# Patient Record
Sex: Female | Born: 1965 | Race: White | Hispanic: No | Marital: Married | Smoking: Current every day smoker
Health system: Southern US, Community
[De-identification: ages and names within clinical notes are randomized; demographics above are authoritative.]

## PROBLEM LIST (undated history)

## (undated) DIAGNOSIS — M1990 Unspecified osteoarthritis, unspecified site: Secondary | ICD-10-CM

## (undated) DIAGNOSIS — N200 Calculus of kidney: Secondary | ICD-10-CM

## (undated) DIAGNOSIS — C801 Malignant (primary) neoplasm, unspecified: Secondary | ICD-10-CM

## (undated) DIAGNOSIS — M7580 Other shoulder lesions, unspecified shoulder: Secondary | ICD-10-CM

## (undated) DIAGNOSIS — C539 Malignant neoplasm of cervix uteri, unspecified: Secondary | ICD-10-CM

## (undated) HISTORY — PX: ABDOMINAL HYSTERECTOMY: SHX81

---

## 2014-10-17 ENCOUNTER — Ambulatory Visit

## 2014-10-17 ENCOUNTER — Ambulatory Visit
Admission: EM | Admit: 2014-10-17 | Discharge: 2014-10-17 | Disposition: A | Discharge status: Home / Self Care | Attending: Family Medicine | Admitting: Family Medicine

## 2014-10-17 DIAGNOSIS — R300 Dysuria: Secondary | ICD-10-CM | POA: Diagnosis not present

## 2014-10-17 HISTORY — DX: Calculus of kidney: N20.0

## 2014-10-17 HISTORY — DX: Malignant (primary) neoplasm, unspecified: C80.1

## 2014-10-17 HISTORY — DX: Malignant neoplasm of cervix uteri, unspecified: C53.9

## 2014-10-17 LAB — URINALYSIS COMPLETE WITH MICROSCOPIC (ARMC ONLY)
BILIRUBIN URINE: NEGATIVE
Glucose, UA: NEGATIVE mg/dL
Hgb urine dipstick: NEGATIVE
KETONES UR: NEGATIVE mg/dL
Leukocytes, UA: NEGATIVE
Nitrite: NEGATIVE
Protein, ur: NEGATIVE mg/dL
RBC / HPF: NONE SEEN RBC/hpf (ref <3)
Specific Gravity, Urine: 1.02 (ref 1.005–1.030)
WBC, UA: NONE SEEN WBC/hpf (ref <3)
pH: 6 (ref 5.0–8.0)

## 2014-10-17 MED ORDER — IBUPROFEN 800 MG PO TABS: 800.0000 mg | ORAL_TABLET | Freq: Three times a day (TID) | ORAL | Status: DC | PRN

## 2014-10-17 NOTE — ED Provider Notes (Signed)
CSN: [id number]     Arrival date & time 10/17/14  1937 History   First MD Initiated Contact with Patient 10/17/14 1027     Chief Complaint  Patient presents with  . Medication Refill   (Consider location/radiation/quality/duration/timing/severity/associated sxs/prior Treatment) HPI Comments: Single caucasian female here for refill of vicodin as passed kidney stone yesterday still having right hip/groin pain radiating took vicodin 1 tab 0805 this am for 8/10 pain currently 0/10.  Denied fever, chills, headache, blood in urine, flank/back pain, nausea, vomiting, diarrhea, abdomen pain.  Patient in [place] visiting daughter.  Stated fills rx at [facility] and [facility].  Has used flomax in the past.  Does not have urine strainer.  Burning with urination also.  The history is provided by the patient.    Past Medical History  Diagnosis Date  . Kidney stones   . Cancer   . Cervical cancer    Past Surgical History  Procedure Laterality Date  . Abdominal hysterectomy     Family History  Problem Relation Age of Onset  . Cancer Mother    Social History  Substance Use Topics  . Smoking status: Current Every Day Smoker -- 2.00 packs/day  . Smokeless tobacco: None  . Alcohol Use: No   OB History    No data available     Review of Systems  Constitutional: Negative for fever, chills, diaphoresis, activity change, appetite change, fatigue and unexpected weight change.  HENT: Negative for congestion, dental problem, drooling, ear discharge, ear pain, facial swelling, hearing loss, mouth sores, nosebleeds, trouble swallowing and voice change.   Eyes: Negative for photophobia, pain, discharge, redness, itching and visual disturbance.  Respiratory: Negative for cough, choking, shortness of breath, wheezing and stridor.   Cardiovascular: Negative for chest pain and leg swelling.  Gastrointestinal: Negative for nausea, vomiting, abdominal pain, diarrhea, constipation, blood in stool and abdominal  distention.  Endocrine: Negative for cold intolerance and heat intolerance.  Genitourinary: Negative for dysuria, urgency, frequency, hematuria, flank pain, decreased urine volume, enuresis, difficulty urinating and pelvic pain.  Musculoskeletal: Negative for myalgias, back pain, joint swelling, arthralgias, gait problem, neck pain and neck stiffness.  Skin: Negative for color change, pallor, rash and wound.  Allergic/Immunologic: Negative for environmental allergies and food allergies.  Neurological: Negative for dizziness, tremors, seizures, syncope, facial asymmetry, speech difficulty, weakness, light-headedness, numbness and headaches.  Hematological: Negative for adenopathy. Does not bruise/bleed easily.  Psychiatric/Behavioral: Negative for behavioral problems, confusion, sleep disturbance and agitation.    Allergies  Review of patient's allergies indicates no known allergies.  Home Medications   Prior to Admission medications   Medication Sig Start Date End Date Taking? Authorizing Provider  HYDROcodone-acetaminophen (NORCO/VICODIN) 5-325 MG per tablet Take 1 tablet by mouth every 6 (six) hours as needed for moderate pain.   Yes Historical Provider, MD   Meds Ordered and Administered this Visit  Medications - No data to display  BP 129/79 mmHg  Pulse 74  Temp(Src) 97 F (36.1 C) (Tympanic)  Resp 16  Ht 5\' 2"  (1.575 m)  Wt 130 lb (58.968 kg)  BMI 23.77 kg/m2  SpO2 100% No data found.   Physical Exam  Constitutional: She is oriented to person, place, and time. Vital signs are normal. She appears well-developed and well-nourished. No distress.  HENT:  Head: Normocephalic and atraumatic.  Right Ear: External ear normal.  Left Ear: External ear normal.  Nose: Nose normal.  Mouth/Throat: Oropharynx is clear and moist. No oropharyngeal exudate.  Eyes: Conjunctivae,  EOM and lids are normal. Pupils are equal, round, and reactive to light. Right eye exhibits no discharge. Left  eye exhibits no discharge. No scleral icterus.  Neck: Normal range of motion. Neck supple. No tracheal deviation present.  Cardiovascular: Normal rate, regular rhythm, normal heart sounds and intact distal pulses.  Exam reveals no gallop and no friction rub.   No murmur heard. Pulmonary/Chest: Effort normal and breath sounds normal. No stridor. No respiratory distress. She has no wheezes. She has no rales.  Abdominal: Soft. Bowel sounds are normal. She exhibits no shifting dullness, no distension, no pulsatile liver, no fluid wave, no abdominal bruit, no ascites, no pulsatile midline mass and no mass. There is no hepatosplenomegaly. There is no tenderness. There is no rigidity, no rebound, no guarding, no CVA tenderness, no tenderness at McBurney's point and negative Murphy's sign. Hernia confirmed negative in the ventral area.  Musculoskeletal: Normal range of motion. She exhibits no edema or tenderness.  Lymphadenopathy:    She has no cervical adenopathy.  Neurological: She is alert and oriented to person, place, and time. She exhibits normal muscle tone. Coordination normal.  Skin: Skin is warm, dry and intact. No rash noted. She is not diaphoretic. No erythema. No pallor.  Psychiatric: She has a normal mood and affect. Her speech is normal and behavior is normal. Judgment and thought content normal. Cognition and memory are normal.  Nursing note and vitals reviewed.   ED Course  Procedures (including critical care time)  Labs Review Labs Reviewed  URINALYSIS COMPLETEWITH MICROSCOPIC ([place]) - Abnormal; Notable for the following:    Squamous Epithelial / LPF 0-5 (*)    All other components within normal limits  URINE CULTURE    Imaging Review No results found.  1050 KUB pending.  Discussed urinalysis results with patient and given copy of report.  Refused offer of toradol at this time.  Prefers motrin.  Discussed straining urine.  Would like flomax Rx.  Signed authorization for  records release [place] providers.  Reviewed [place] Controlled Substances website patient has not received any Rx since Mar 2016 in [place].  1100 patient asked to go out to car to get phone to make important call while waiting for xray tech.  Patient ambulatory to car without difficulty.  [facility] and [facility].  [facility] stated no vicodin Rx filled in past 6 months.  [facility].  Last UTI 07/28/2014 urine culture contaminated multiple species  Cr 1.07.  1115 Checked for patient in exam room and waiting room not there.  Called contact # [phone] and daughter stated patient at doctor not at home.  Asked daughter to contact mother to let her know we are still waiting for her to come back inside/return for xrays/labs.  Daughter verbalized understanding of information/instructions and had no further questions at this time.  [place] contacted daughter again via telephone. Daughter was unable to reach patient via cell phone.   Patient has not returned to clinic front desk to complete examination.  Security checked parking lot to ensure patient not in distress in parking lot.  Dr Zenda Alpers notified.  MDM   1. Dysuria   Plan: 1. Test/x-ray results and diagnosis reviewed with patient and given copy of urinalysis results.  Discussed what crystals were with patient. 2. rx as per orders; risks, benefits, potential side effects reviewed with patient 3. Recommend supportive treatment with fluids, motrin 4. F/u prn if symptoms worsen or don't improve   Urine culture  will be available in 48 hours will call with results once available.  Strain all urine.  Hydrate, hydrate.  Motrin 800mg  po TID. Patient is also to push fluids and strain urine. Call or return to clinic as needed if these symptoms worsen or fail to improve as anticipated e.g. gross hematuria, fever, worsening pain, unable to void.  Exitcare handout on nephrolithiasis, dysuria, diet to prevent kidney stones given to patient.   Patient verbalized agreement and understanding of treatment plan and had no further questions at this time. P2:  Hydrate, post coital urination, and cranberry juice  Olen Cordial, NP 10/17/14 1230

## 2014-10-17 NOTE — ED Notes (Signed)
Hx of Kidney Stones. States passed small kidney stone at 2am. Had right flank pain and took "my last Vicodin". "I just need some more pain medication".

## 2014-10-17 NOTE — ED Notes (Signed)
Patient had been waiting for an xray, but stated "had to out to car". After waiting 45 minutes, documented that patient left AMA without signing. All incomplete orders cancelled

## 2014-10-17 NOTE — Discharge Instructions (Signed)
Dysuria Dysuria is the medical term for pain with urination. There are many causes for dysuria, but urinary tract infection is the most common. If a urinalysis was performed it can show that there is a urinary tract infection. A urine culture confirms that you or your child is sick. You will need to follow up with a healthcare provider because:  If a urine culture was done you will need to know the culture results and treatment recommendations.  If the urine culture was positive, you or your child will need to be put on antibiotics or know if the antibiotics prescribed are the right antibiotics for your urinary tract infection.  If the urine culture is negative (no urinary tract infection), then other causes may need to be explored or antibiotics need to be stopped. Today laboratory work may have been done and there does not seem to be an infection. If cultures were done they will take at least 24 to 48 hours to be completed. Today x-rays may have been taken and they read as normal. No cause can be found for the problems. The x-rays may be re-read by a radiologist and you will be contacted if additional findings are made. You or your child may have been put on medications to help with this problem until you can see your primary caregiver. If the problems get better, see your primary caregiver if the problems return. If you were given antibiotics (medications which kill germs), take all of the mediations as directed for the full course of treatment.  If laboratory work was done, you need to find the results. Leave a telephone number where you can be reached. If this is not possible, make sure you find out how you are to get test results. HOME CARE INSTRUCTIONS   Drink lots of fluids. For adults, drink eight, 8 ounce glasses of clear juice or water a day. For children, replace fluids as suggested by your caregiver.  Empty the bladder often. Avoid holding urine for long periods of time.  After a bowel  movement, women should cleanse front to back, using each tissue only once.  Empty your bladder before and after sexual intercourse.  Take all the medicine given to you until it is gone. You may feel better in a few days, but TAKE ALL MEDICINE.  Avoid caffeine, tea, alcohol and carbonated beverages, because they tend to irritate the bladder.  In men, alcohol may irritate the prostate.  Only take over-the-counter or prescription medicines for pain, discomfort, or fever as directed by your caregiver.  If your caregiver has given you a follow-up appointment, it is very important to keep that appointment. Not keeping the appointment could result in a chronic or permanent injury, pain, and disability. If there is any problem keeping the appointment, you must call back to this facility for assistance. SEEK IMMEDIATE MEDICAL CARE IF:   Back pain develops.  A fever develops.  There is nausea (feeling sick to your stomach) or vomiting (throwing up).  Problems are no better with medications or are getting worse. MAKE SURE YOU:   Understand these instructions.  Will watch your condition.  Will get help right away if you are not doing well or get worse. Document Released: 10/16/2003 Document Revised: 04/11/2011 Document Reviewed: 08/23/2007 [facility] Patient Information 2015 [place], [place]. This information is not intended to replace advice given to you by your health care provider. Make sure you discuss any questions you have with your health care provider. Dietary Guidelines to Help  Prevent Kidney Stones Your risk of kidney stones can be decreased by adjusting the foods you eat. The most important thing you can do is drink enough fluid. You should drink enough fluid to keep your urine clear or pale yellow. The following guidelines provide specific information for the type of kidney stone you have had. GUIDELINES ACCORDING TO TYPE OF KIDNEY STONE Calcium Oxalate Kidney Stones  Reduce the  amount of salt you eat. Foods that have a lot of salt cause your body to release excess calcium into your urine. The excess calcium can combine with a substance called oxalate to form kidney stones.  Reduce the amount of animal protein you eat if the amount you eat is excessive. Animal protein causes your body to release excess calcium into your urine. Ask your dietitian how much protein from animal sources you should be eating.  Avoid foods that are high in oxalates. If you take vitamins, they should have less than 500 mg of vitamin C. Your body turns vitamin C into oxalates. You do not need to avoid fruits and vegetables high in vitamin C. Calcium Phosphate Kidney Stones  Reduce the amount of salt you eat to help prevent the release of excess calcium into your urine.  Reduce the amount of animal protein you eat if the amount you eat is excessive. Animal protein causes your body to release excess calcium into your urine. Ask your dietitian how much protein from animal sources you should be eating.  Get enough calcium from food or take a calcium supplement (ask your dietitian for recommendations). Food sources of calcium that do not increase your risk of kidney stones include:  Broccoli.  Dairy products, such as cheese and yogurt.  Pudding. Uric Acid Kidney Stones  Do not have more than 6 oz of animal protein per day. FOOD SOURCES Animal Protein Sources  Meat (all types).  Poultry.  Eggs.  Fish, seafood. Foods High in [employer] seasonings.  Soy sauce.  Teriyaki sauce.  Cured and processed meats.  Salted crackers and snack foods.  Fast food.  Canned soups and most canned foods. Foods High in Oxalates  Grains:  Amaranth.  Barley.  Grits.  Wheat germ.  Bran.  Buckwheat flour.  All bran cereals.  Pretzels.  Whole wheat bread.  Vegetables:  Beans (wax).  Beets and beet greens.  Collard  greens.  Eggplant.  [place].  Leeks.  Okra.  Parsley.  Rutabagas.  Spinach.  Swiss chard.  Tomato paste.  Fried potatoes.  Sweet potatoes.  Fruits:  Red currants.  Figs.  Kiwi.  Rhubarb.  Meat and Other Protein Sources:  Beans (dried).  Soy burgers and other soybean products.  Miso.  Nuts (peanuts, almonds, pecans, cashews, hazelnuts).  Nut butters.  Sesame seeds and tahini (paste made of sesame seeds).  Poppy seeds.  Beverages:  Chocolate drink mixes.  Soy milk.  Instant iced tea.  Juices made from high-oxalate fruits or vegetables.  Other:  Carob.  Chocolate.  Fruitcake.  Marmalades. Document Released: 05/14/2010 Document Revised: 01/22/2013 Document Reviewed: 12/14/2012 [facility] Patient Information 2015 [place], [place]. This information is not intended to replace advice given to you by your health care provider. Make sure you discuss any questions you have with your health care provider. Kidney Stones Kidney stones (urolithiasis) are deposits that form inside your kidneys. The intense pain is caused by the stone moving through the urinary tract. When the stone moves, the ureter goes into spasm around the stone. The stone is  usually passed in the urine.  CAUSES   A disorder that makes certain neck glands produce too much parathyroid hormone (primary hyperparathyroidism).  A buildup of uric acid crystals, similar to gout in your joints.  Narrowing (stricture) of the ureter.  A kidney obstruction present at birth (congenital obstruction).  Previous surgery on the kidney or ureters.  Numerous kidney infections. SYMPTOMS   Feeling sick to your stomach (nauseous).  Throwing up (vomiting).  Blood in the urine (hematuria).  Pain that usually spreads (radiates) to the groin.  Frequency or urgency of urination. DIAGNOSIS   Taking a history and physical exam.  Blood or urine tests.  CT scan.  Occasionally, an  examination of the inside of the urinary bladder (cystoscopy) is performed. TREATMENT   Observation.  Increasing your fluid intake.  Extracorporeal shock wave lithotripsy--This is a noninvasive procedure that uses shock waves to break up kidney stones.  Surgery may be needed if you have severe pain or persistent obstruction. There are various surgical procedures. Most of the procedures are performed with the use of small instruments. Only small incisions are needed to accommodate these instruments, so recovery time is minimized. The size, location, and chemical composition are all important variables that will determine the proper choice of action for you. Talk to your health care provider to better understand your situation so that you will minimize the risk of injury to yourself and your kidney.  HOME CARE INSTRUCTIONS   Drink enough water and fluids to keep your urine clear or pale yellow. This will help you to pass the stone or stone fragments.  Strain all urine through the provided strainer. Keep all particulate matter and stones for your health care provider to see. The stone causing the pain may be as small as a grain of salt. It is very important to use the strainer each and every time you pass your urine. The collection of your stone will allow your health care provider to analyze it and verify that a stone has actually passed. The stone analysis will often identify what you can do to reduce the incidence of recurrences.  Only take over-the-counter or prescription medicines for pain, discomfort, or fever as directed by your health care provider.  Make a follow-up appointment with your health care provider as directed.  Get follow-up X-rays if required. The absence of pain does not always mean that the stone has passed. It may have only stopped moving. If the urine remains completely obstructed, it can cause loss of kidney function or even complete destruction of the kidney. It is your  responsibility to make sure X-rays and follow-ups are completed. Ultrasounds of the kidney can show blockages and the status of the kidney. Ultrasounds are not associated with any radiation and can be performed easily in a matter of minutes. SEEK MEDICAL CARE IF:  You experience pain that is progressive and unresponsive to any pain medicine you have been prescribed. SEEK IMMEDIATE MEDICAL CARE IF:   Pain cannot be controlled with the prescribed medicine.  You have a fever or shaking chills.  The severity or intensity of pain increases over 18 hours and is not relieved by pain medicine.  You develop a new onset of abdominal pain.  You feel faint or pass out.  You are unable to urinate. MAKE SURE YOU:   Understand these instructions.  Will watch your condition.  Will get help right away if you are not doing well or get worse. Document Released: 01/17/2005 Document Revised:  09/19/2012 Document Reviewed: 06/20/2012 [employer] Patient Information 2015 [place], [place]. This information is not intended to replace advice given to you by your health care provider. Make sure you discuss any questions you have with your health care provider.

## 2014-10-19 LAB — URINE CULTURE

## 2014-11-03 ENCOUNTER — Emergency Department
Admission: EM | Admit: 2014-11-03 | Discharge: 2014-11-03 | Disposition: A | Discharge status: Home / Self Care | Attending: Emergency Medicine | Admitting: Emergency Medicine

## 2014-11-03 ENCOUNTER — Emergency Department

## 2014-11-03 ENCOUNTER — Encounter: Payer: Self-pay | Admitting: Emergency Medicine

## 2014-11-03 DIAGNOSIS — Z720 Tobacco use: Secondary | ICD-10-CM | POA: Insufficient documentation

## 2014-11-03 DIAGNOSIS — M779 Enthesopathy, unspecified: Secondary | ICD-10-CM

## 2014-11-03 DIAGNOSIS — M47896 Other spondylosis, lumbar region: Secondary | ICD-10-CM

## 2014-11-03 DIAGNOSIS — M545 Low back pain: Secondary | ICD-10-CM | POA: Diagnosis present

## 2014-11-03 LAB — URINALYSIS COMPLETE WITH MICROSCOPIC (ARMC ONLY)
BILIRUBIN URINE: NEGATIVE
Bacteria, UA: NONE SEEN
GLUCOSE, UA: NEGATIVE mg/dL
Hgb urine dipstick: NEGATIVE
KETONES UR: NEGATIVE mg/dL
Leukocytes, UA: NEGATIVE
Nitrite: NEGATIVE
PH: 7 (ref 5.0–8.0)
Protein, ur: NEGATIVE mg/dL
Specific Gravity, Urine: 1.011 (ref 1.005–1.030)

## 2014-11-03 MED ORDER — HYDROCODONE-ACETAMINOPHEN 5-325 MG PO TABS: 1.0000 | ORAL_TABLET | ORAL | Status: DC | PRN

## 2014-11-03 MED ORDER — NAPROXEN 500 MG PO TBEC: 500.0000 mg | DELAYED_RELEASE_TABLET | Freq: Two times a day (BID) | ORAL | Status: DC

## 2014-11-03 NOTE — ED Provider Notes (Signed)
[facility] Emergency Department Provider Note  ____________________________________________  Time seen: Approximately 10:52 AM  I have reviewed the triage vital signs and the nursing notes.   HISTORY  Chief Complaint Back Pain    HPI Alejandra Henry is a 49 y.o. female who presents for evaluation of low back pain. Patient states past medical history significant for cervical cancer only took a Vicodin this morning and has actually felt better, still unsure if it's a kidney stone or if it something going on with her spine.   Past Medical History  Diagnosis Date  . Kidney stones   . Cancer ([place])   . Cervical cancer ([place])     There are no active problems to display for this patient.   Past Surgical History  Procedure Laterality Date  . Abdominal hysterectomy      Current Outpatient Rx  Name  Route  Sig  Dispense  Refill  . HYDROcodone-acetaminophen (NORCO) 5-325 MG tablet   Oral   Take 1-2 tablets by mouth every 4 (four) hours as needed for moderate pain.   15 tablet   0   . ibuprofen (ADVIL,MOTRIN) 800 MG tablet   Oral   Take 1 tablet (800 mg total) by mouth every 8 (eight) hours as needed for moderate pain.   6 tablet   0   . naproxen (EC NAPROSYN) 500 MG EC tablet   Oral   Take 1 tablet (500 mg total) by mouth 2 (two) times daily with a meal.   60 tablet   0     Allergies Review of patient's allergies indicates no known allergies.  Family History  Problem Relation Age of Onset  . Cancer Mother     Social History Social History  Substance Use Topics  . Smoking status: Current Every Day Smoker -- 2.00 packs/day  . Smokeless tobacco: None  . Alcohol Use: No    Review of Systems Constitutional: No fever/chills Eyes: No visual changes. ENT: No sore throat. Cardiovascular: Denies chest pain. Respiratory: Denies shortness of breath. Gastrointestinal: No abdominal pain.  No nausea, no vomiting.  No diarrhea.  No  constipation. Genitourinary: Negative for dysuria. Musculoskeletal: Positive for low back pain. Skin: Negative for rash. Neurological: Negative for headaches, focal weakness or numbness.  10-point ROS otherwise negative.  ____________________________________________   PHYSICAL EXAM:  VITAL SIGNS: ED Triage Vitals  Enc Vitals Group     BP 11/03/14 1042 125/76 mmHg     Pulse Rate 11/03/14 1042 82     Resp 11/03/14 1042 18     Temp 11/03/14 1042 98.3 F (36.8 C)     Temp Source 11/03/14 1042 Oral     SpO2 11/03/14 1042 99 %     Weight 11/03/14 1042 130 lb (58.968 kg)     Height 11/03/14 1042 5\' 1"  (1.549 m)     Head Cir --      Peak Flow --      Pain Score 11/03/14 1039 10     Pain Loc --      Pain Edu? --      Excl. in [place]? --     Constitutional: Alert and oriented. Well appearing and in no acute distress. Eyes: Conjunctivae are normal. PERRL. EOMI. Head: Atraumatic. Nose: No congestion/rhinnorhea. Mouth/Throat: Mucous membranes are moist.  Oropharynx non-erythematous. Neck: No stridor.   Cardiovascular: Normal rate, regular rhythm. Grossly normal heart sounds.  Good peripheral circulation. Respiratory: Normal respiratory effort.  No retractions. Lungs CTAB. Gastrointestinal: Soft and  nontender. No distention. No abdominal bruits. No CVA tenderness. Musculoskeletal: No lower extremity tenderness nor edema.  No joint effusions. Minimal point tenderness lumbar spine. Negative CVAT Neurologic:  Normal speech and language. No gross focal neurologic deficits are appreciated. No gait instability. Skin:  Skin is warm, dry and intact. No rash noted. Psychiatric: Mood and affect are normal. Speech and behavior are normal.  ____________________________________________   LABS (all labs ordered are listed, but only abnormal results are displayed)  Labs Reviewed  URINALYSIS COMPLETEWITH MICROSCOPIC ([facility] ONLY) - Abnormal; Notable for the following:    Color, Urine STRAW (*)     APPearance CLEAR (*)    Squamous Epithelial / LPF 0-5 (*)    All other components within normal limits   ____________________________________________  RADIOLOGY  On spurs noted with degenerative changes. No acute fracture subluxation. ____________________________________________   PROCEDURES  Procedure(s) performed: None  Critical Care performed: No  ____________________________________________   INITIAL IMPRESSION / ASSESSMENT AND PLAN / ED COURSE  Pertinent labs & imaging results that were available during my care of the patient were reviewed by me and considered in my medical decision making (see chart for details).  Degenerative changes noted to the back. Rx given for Anaprox DS twice a day. Patient is follow-up with PCP upon return home for continuity of care Otherwise take Vicodin for short-term pain. She voices understanding and offers no other emergency medical complaints at this time. ____________________________________________   FINAL CLINICAL IMPRESSION(S) / ED DIAGNOSES  Final diagnoses:  Other osteoarthritis of spine, lumbar region  Bone spur      Arlyss Repress, PA-C 11/03/14 1248  Lavonia Drafts, MD 11/03/14 1426

## 2014-11-03 NOTE — ED Notes (Signed)
Reports back pain for several days.  Ambulates well.

## 2014-11-08 ENCOUNTER — Encounter: Payer: Self-pay | Admitting: *Deleted

## 2014-11-08 ENCOUNTER — Emergency Department
Admission: EM | Admit: 2014-11-08 | Discharge: 2014-11-08 | Disposition: A | Discharge status: Home / Self Care | Attending: Emergency Medicine | Admitting: Emergency Medicine

## 2014-11-08 ENCOUNTER — Emergency Department

## 2014-11-08 DIAGNOSIS — M545 Low back pain, unspecified: Secondary | ICD-10-CM

## 2014-11-08 DIAGNOSIS — Z791 Long term (current) use of non-steroidal anti-inflammatories (NSAID): Secondary | ICD-10-CM | POA: Diagnosis not present

## 2014-11-08 DIAGNOSIS — J439 Emphysema, unspecified: Secondary | ICD-10-CM

## 2014-11-08 DIAGNOSIS — Z720 Tobacco use: Secondary | ICD-10-CM | POA: Diagnosis not present

## 2014-11-08 DIAGNOSIS — J441 Chronic obstructive pulmonary disease with (acute) exacerbation: Secondary | ICD-10-CM | POA: Diagnosis not present

## 2014-11-08 HISTORY — DX: Unspecified osteoarthritis, unspecified site: M19.90

## 2014-11-08 HISTORY — DX: Other shoulder lesions, unspecified shoulder: M75.80

## 2014-11-08 MED ORDER — PSEUDOEPH-BROMPHEN-DM 30-2-10 MG/5ML PO SYRP: 5.0000 mL | ORAL_SOLUTION | Freq: Four times a day (QID) | ORAL | Status: DC | PRN

## 2014-11-08 MED ORDER — ALBUTEROL SULFATE HFA 108 (90 BASE) MCG/ACT IN AERS: 2.0000 | INHALATION_SPRAY | Freq: Four times a day (QID) | RESPIRATORY_TRACT | Status: AC | PRN

## 2014-11-08 MED ORDER — TRAMADOL HCL 50 MG PO TABS: 50.0000 mg | ORAL_TABLET | Freq: Four times a day (QID) | ORAL | Status: DC | PRN

## 2014-11-08 MED ORDER — MELOXICAM 15 MG PO TABS: 15.0000 mg | ORAL_TABLET | Freq: Every day | ORAL | Status: DC

## 2014-11-08 NOTE — ED Notes (Signed)
  Pt arrived to ED reporting back pain beginning 1 week ago. Pt was seen in ED and given pain medication. Pt states that pain medication has run out but pain continues. Pt deneis having a follow-up after leaving ED last week. Pt also reports having been coughing for the past week and verbalizes fear of pneumonia. Pt reports clear sputum at this time but rpeorts 3 days ago having yellow and green sputum. Wheezing noted in triage.

## 2014-11-08 NOTE — ED Provider Notes (Signed)
[facility] Emergency Department Provider Note  ____________________________________________  Time seen: Approximately 7:45 PM  I have reviewed the triage vital signs and the nursing notes.   HISTORY  Chief Complaint Back Pain    HPI Alejandra Henry is a 49 y.o. female patient here for second visit for back pain. Patient was totally she's has degenerative changes in the back along with some spurs. Patient was seen a week ago given Vicodin and naproxen patient states she's run out of these medications. Patient also complaining of a productive cough for 3 days. Patient admits tobacco use and history of COPD. Patient denies any radicular component to her back pain. She denies any bladder or bowel dysfunction. Patient denies any dyspnea or cough. Patient has some mild wheezing. Patient states she is here for approximately one more month visiting her daughter before she returned back to her home station. Patient has not sought out her PCP for this location. Past Medical History  Diagnosis Date  . Kidney stones   . Cancer ([place])   . Cervical cancer ([place])   . AC (acromioclavicular) joint bone spurs   . Arthritis     There are no active problems to display for this patient.   Past Surgical History  Procedure Laterality Date  . Abdominal hysterectomy      Current Outpatient Rx  Name  Route  Sig  Dispense  Refill  . albuterol (PROVENTIL HFA;VENTOLIN HFA) 108 (90 BASE) MCG/ACT inhaler   Inhalation   Inhale 2 puffs into the lungs every 6 (six) hours as needed for wheezing or shortness of breath.   1 Inhaler   2   . brompheniramine-pseudoephedrine-DM 30-2-10 MG/5ML syrup   Oral   Take 5 mLs by mouth 4 (four) times daily as needed.   120 mL   0   . HYDROcodone-acetaminophen (NORCO) 5-325 MG tablet   Oral   Take 1-2 tablets by mouth every 4 (four) hours as needed for moderate pain.   15 tablet   0   . ibuprofen (ADVIL,MOTRIN) 800 MG tablet   Oral   Take 1  tablet (800 mg total) by mouth every 8 (eight) hours as needed for moderate pain.   6 tablet   0   . meloxicam (MOBIC) 15 MG tablet   Oral   Take 1 tablet (15 mg total) by mouth daily.   30 tablet   0   . naproxen (EC NAPROSYN) 500 MG EC tablet   Oral   Take 1 tablet (500 mg total) by mouth 2 (two) times daily with a meal.   60 tablet   0   . traMADol (ULTRAM) 50 MG tablet   Oral   Take 1 tablet (50 mg total) by mouth every 6 (six) hours as needed for moderate pain.   12 tablet   0     Allergies Review of patient's allergies indicates no known allergies.  Family History  Problem Relation Age of Onset  . Cancer Mother     Social History Social History  Substance Use Topics  . Smoking status: Current Every Day Smoker -- 1.00 packs/day  . Smokeless tobacco: None  . Alcohol Use: No    Review of Systems Constitutional: No fever/chills Eyes: No visual changes. ENT: No sore throat. Cardiovascular: Denies chest pain. Respiratory: Productive cough and wheezing Gastrointestinal: No abdominal pain.  No nausea, no vomiting.  No diarrhea.  No constipation. Genitourinary: Negative for dysuria. Musculoskeletal positive for back pain. Skin: Negative for rash.  Neurological: Negative for headaches, focal weakness or numbness. 10-point ROS otherwise negative.  ____________________________________________   PHYSICAL EXAM:  VITAL SIGNS: ED Triage Vitals  Enc Vitals Group     BP 11/08/14 1830 134/77 mmHg     Pulse Rate 11/08/14 1830 88     Resp 11/08/14 1830 16     Temp 11/08/14 1830 98 F (36.7 C)     Temp Source 11/08/14 1830 Oral     SpO2 11/08/14 1830 99 %     Weight 11/08/14 1830 130 lb (58.968 kg)     Height 11/08/14 1830 5\' 2"  (1.575 m)     Head Cir --      Peak Flow --      Pain Score 11/08/14 1830 0     Pain Loc --      Pain Edu? --      Excl. in [place]? --     Constitutional: Alert and oriented. Well appearing and in no acute distress. Eyes: Conjunctivae  are normal. PERRL. EOMI. Head: Atraumatic. Nose: No congestion/rhinnorhea. Mouth/Throat: Mucous membranes are moist.  Oropharynx non-erythematous. Neck: No stridor. No cervical spine tenderness to palpation. Hematological/Lymphatic/Immunilogical: No cervical lymphadenopathy. Cardiovascular: Normal rate, regular rhythm. Grossly normal heart sounds.  Good peripheral circulation. Respiratory: Normal respiratory effort.  No retractions. Lungs inspiratory wheezing. Gastrointestinal: Soft and nontender. No distention. No abdominal bruits. No CVA tenderness. Musculoskeletal: No lower extremity tenderness nor edema.  No joint effusions. Neurologic:  Normal speech and language. No gross focal neurologic deficits are appreciated. No gait instability. Skin:  Skin is warm, dry and intact. No rash noted. Psychiatric: Mood and affect are normal. Speech and behavior are normal.  ____________________________________________   LABS (all labs ordered are listed, but only abnormal results are displayed)  Labs Reviewed - No data to display ____________________________________________  EKG   ____________________________________________  RADIOLOGY  Chest x-ray shows no acute findings. Findings are consistent with COPD and emphysema. ____________________________________________   PROCEDURES  Procedure(s) performed: None  Critical Care performed: No  ____________________________________________   INITIAL IMPRESSION / ASSESSMENT AND PLAN / ED COURSE  Pertinent labs & imaging results that were available during my care of the patient were reviewed by me and considered in my medical decision making (see chart for details).  Back pain secondary to degenerative changes and COPD. Patient were given a prescription for tramadol, Mobic, Bromfed-DM, and pro-air inhaler. Patient [facility] or to seek a family practitioner findings location for continued  care. ____________________________________________   FINAL CLINICAL IMPRESSION(S) / ED DIAGNOSES  Final diagnoses:  Back pain at L4-L5 level  Pulmonary emphysema, unspecified emphysema type [facility])      Sable Feil, PA-C 11/08/14 2003  Earleen Newport, MD 11/08/14 2253

## 2014-11-24 ENCOUNTER — Emergency Department
Admission: EM | Admit: 2014-11-24 | Discharge: 2014-11-24 | Disposition: A | Discharge status: Home / Self Care | Attending: Emergency Medicine | Admitting: Emergency Medicine

## 2014-11-24 ENCOUNTER — Encounter: Payer: Self-pay | Admitting: Emergency Medicine

## 2014-11-24 DIAGNOSIS — Z720 Tobacco use: Secondary | ICD-10-CM | POA: Diagnosis not present

## 2014-11-24 DIAGNOSIS — M545 Low back pain, unspecified: Secondary | ICD-10-CM

## 2014-11-24 MED ORDER — DICLOFENAC SODIUM 75 MG PO TBEC: 75.0000 mg | DELAYED_RELEASE_TABLET | Freq: Two times a day (BID) | ORAL | Status: AC

## 2014-11-24 MED ORDER — HYDROCODONE-ACETAMINOPHEN 5-325 MG PO TABS: 1.0000 | ORAL_TABLET | ORAL | Status: AC | PRN

## 2014-11-24 NOTE — Discharge Instructions (Signed)

## 2014-11-24 NOTE — ED Provider Notes (Signed)
[facility] Emergency Department Provider Note  ____________________________________________  Time seen: Approximately 10:33 AM  I have reviewed the triage vital signs and the nursing notes.   HISTORY  Chief Complaint Back Pain    HPI Alejandra Henry is a 49 y.o. female who presents for continued low back pain. Patient states that she is here visiting her daughter who is in the [employer] and helping with her grandchildren. States that she's got a history of bone spurs and cervical cancer and will be returning to [place] at the end of November. She needs something to help her up with the pain while in town.She states that the pain does radiate down her leg sometimes however denies any saddle numbness or paresthesia.   Past Medical History  Diagnosis Date  . Kidney stones   . Cancer ([place])   . Cervical cancer ([place])   . AC (acromioclavicular) joint bone spurs   . Arthritis   . Cervical cancer ([place])     There are no active problems to display for this patient.   Past Surgical History  Procedure Laterality Date  . Abdominal hysterectomy      Current Outpatient Rx  Name  Route  Sig  Dispense  Refill  . albuterol (PROVENTIL HFA;VENTOLIN HFA) 108 (90 BASE) MCG/ACT inhaler   Inhalation   Inhale 2 puffs into the lungs every 6 (six) hours as needed for wheezing or shortness of breath.   1 Inhaler   2   . diclofenac (VOLTAREN) 75 MG EC tablet   Oral   Take 1 tablet (75 mg total) by mouth 2 (two) times daily.   60 tablet   0   . HYDROcodone-acetaminophen (NORCO) 5-325 MG tablet   Oral   Take 1-2 tablets by mouth every 4 (four) hours as needed for moderate pain.   15 tablet   0     Allergies Review of patient's allergies indicates no known allergies.  Family History  Problem Relation Age of Onset  . Cancer Mother     Social History Social History  Substance Use Topics  . Smoking status: Current Every Day Smoker -- 1.00 packs/day  . Smokeless  tobacco: None  . Alcohol Use: No    Review of Systems Constitutional: No fever/chills Eyes: No visual changes. ENT: No sore throat. Cardiovascular: Denies chest pain. Respiratory: Denies shortness of breath. Gastrointestinal: No abdominal pain.  No nausea, no vomiting.  No diarrhea.  No constipation. Genitourinary: Negative for dysuria. Musculoskeletal: Positive for low back pain. Skin: Negative for rash. Neurological: Negative for headaches, focal weakness or numbness.  10-point ROS otherwise negative.  ____________________________________________   PHYSICAL EXAM:  VITAL SIGNS: ED Triage Vitals  Enc Vitals Group     BP --      Pulse --      Resp --      Temp --      Temp src --      SpO2 --      Weight --      Height --      Head Cir --      Peak Flow --      Pain Score 11/24/14 1022 5     Pain Loc --      Pain Edu? --      Excl. in [place]? --     Constitutional: Alert and oriented. Well appearing and in no acute distress. Neck: No stridor.  No cervical spinal tenderness. Cardiovascular: Normal rate, regular rhythm. Grossly  normal heart sounds.  Good peripheral circulation. Respiratory: Normal respiratory effort.  No retractions. Lungs CTAB. Gastrointestinal: Soft and nontender. No distention. No abdominal bruits. No CVA tenderness. Musculoskeletal: No lower extremity tenderness nor edema.  No joint effusions. Trachea leg raise negative. Point tenderness noted to lower lumbar area. Neurologic:  Normal speech and language. No gross focal neurologic deficits are appreciated. No gait instability. Skin:  Skin is warm, dry and intact. No rash noted. Psychiatric: Mood and affect are normal. Speech and behavior are normal.  ____________________________________________   LABS (all labs ordered are listed, but only abnormal results are displayed)  Labs Reviewed - No data to  display ____________________________________________  EKG  Deferred ____________________________________________  RADIOLOGY  Deferred ____________________________________________   PROCEDURES  Procedure(s) performed: None  Critical Care performed: No  ____________________________________________   INITIAL IMPRESSION / ASSESSMENT AND PLAN / ED COURSE  Pertinent labs & imaging results that were available during my care of the patient were reviewed by me and considered in my medical decision making (see chart for details).  Rx given for diclofenac sodium and Vicodin. Patient follow-up with PCP or return to the ER with any worsening symptomology. He voices no other emergency medical complaints at this time. ____________________________________________   FINAL CLINICAL IMPRESSION(S) / ED DIAGNOSES  Final diagnoses:  Low back pain at multiple sites      Arlyss Repress, PA-C 11/24/14 1103  Eula Listen, MD 11/24/14 [phone]

## 2014-11-24 NOTE — ED Notes (Signed)
Says she has back pain.

## 2016-04-09 IMAGING — CR DG LUMBAR SPINE 2-3V
1 series · 3 of 3 positions shown · non-contrast
Comparison: None.

CLINICAL DATA: Low back pain for the past 2 days.

EXAM:
LUMBAR SPINE - 2-3 VIEW

[Series 1: dg lumbar spine 2-3 views · 0.14mm/px · 3 of 3 slices shown]
[im 1/3]
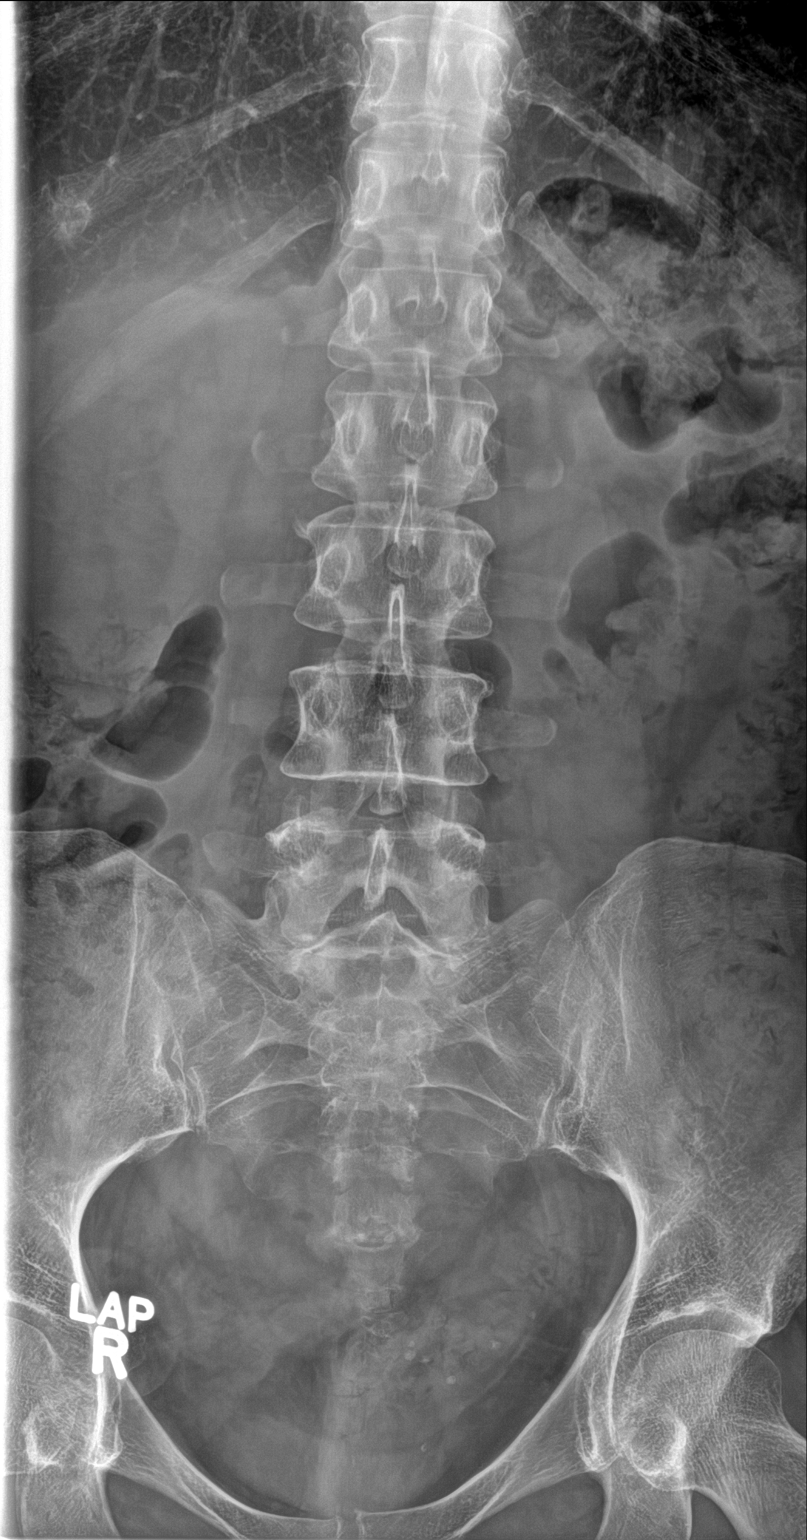
[im 2/3]
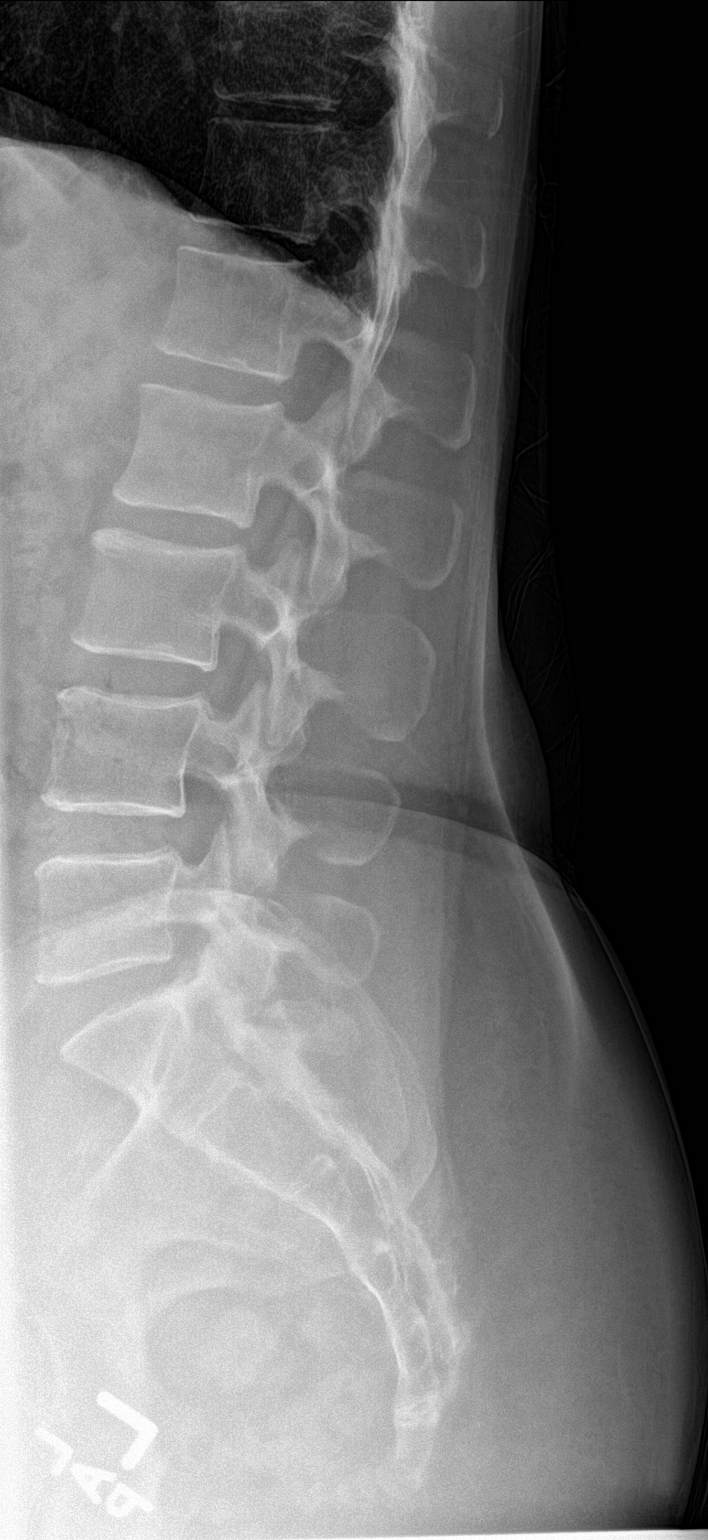
[im 3/3]
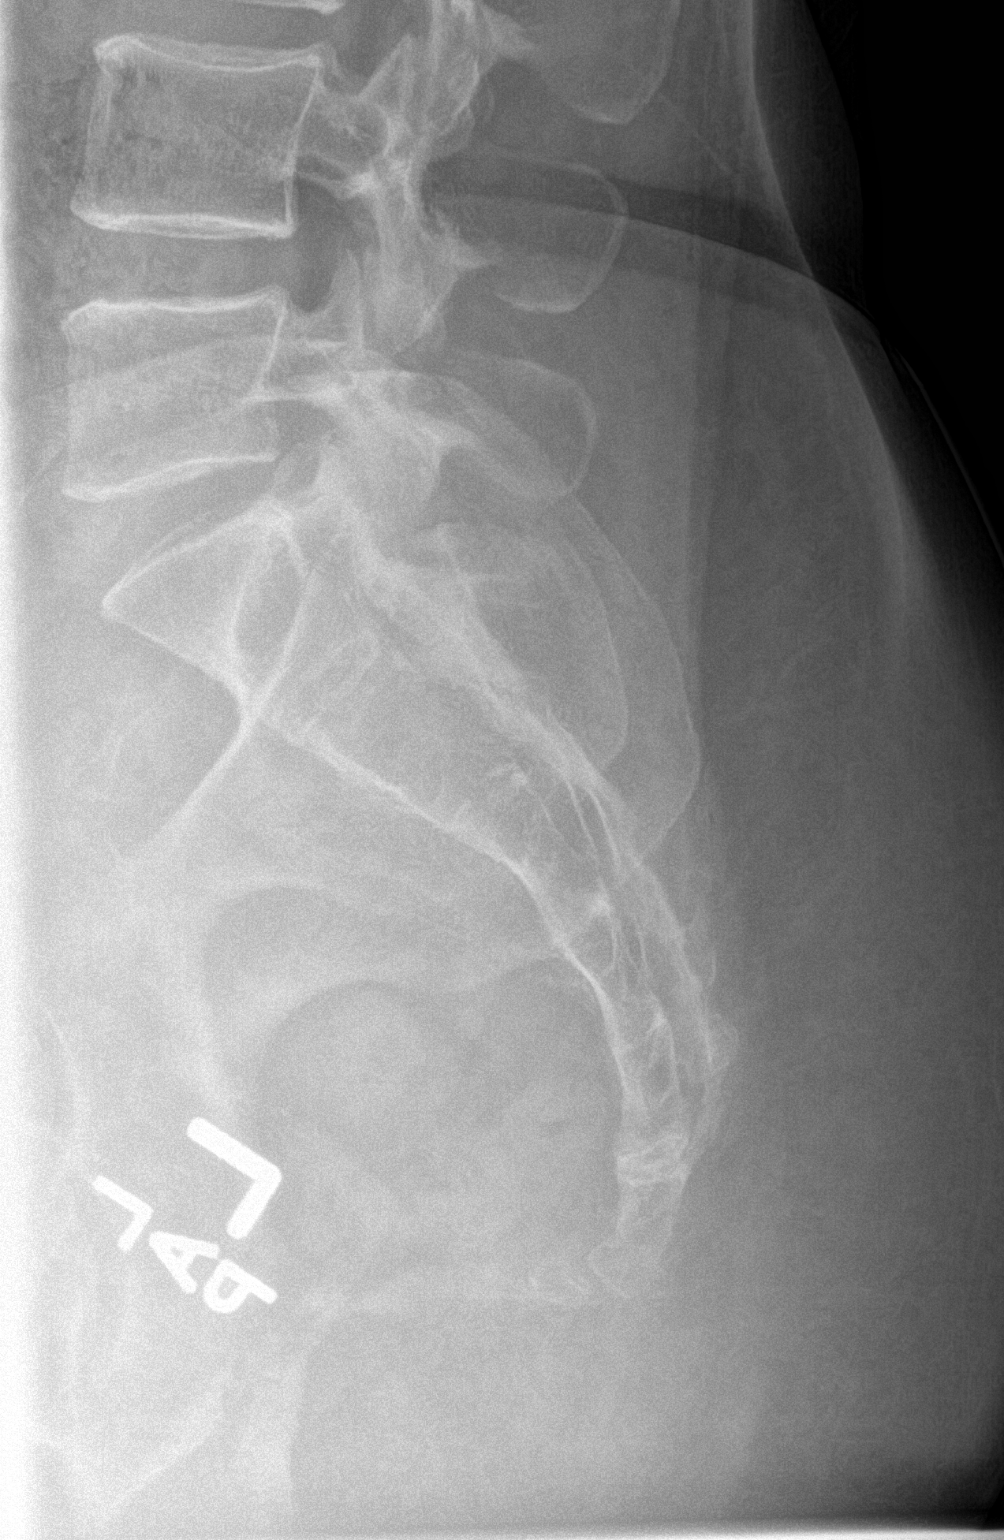

[3 of 3 positions shown; findings below may reference images not displayed]

FINDINGS: Five non-rib-bearing lumbar vertebrae. Minimal anterior and lateral
spur formation at the L2-3, L3-4 and L4-5 levels. No fractures, pars
defects or subluxations.
IMPRESSION: Minimal degenerative changes.

## 2016-04-14 IMAGING — CR DG CHEST 2V
1 series · 2 of 2 positions shown · non-contrast
Comparison: None.

CLINICAL DATA: 48-year-old with 1 week history of productive cough
and shortness of breath. Personal history of cervical cancer.

EXAM:
CHEST  2 VIEW

[Series 1: dg chest 2 view · 0.14mm/px · 2 of 2 slices shown]
[im 1/2]
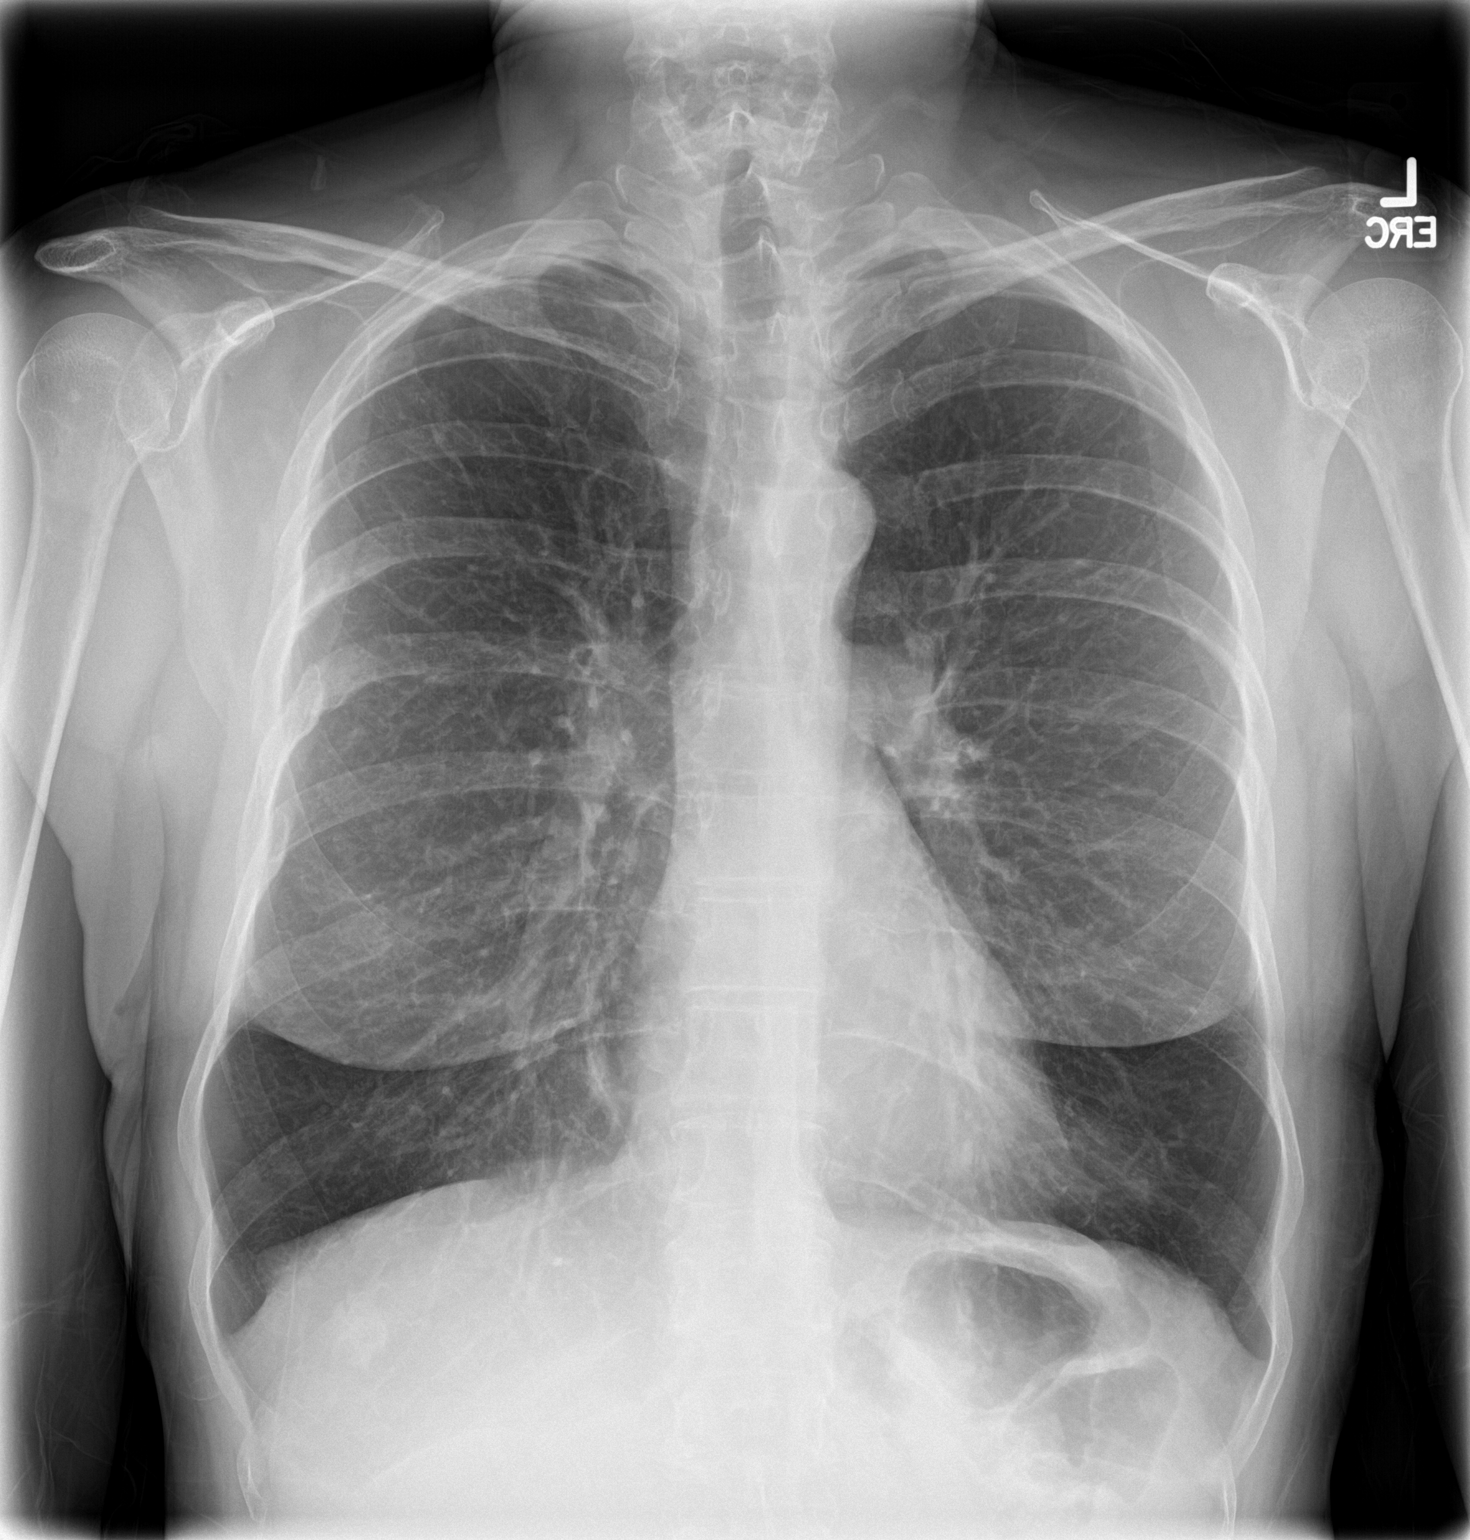
[im 2/2]
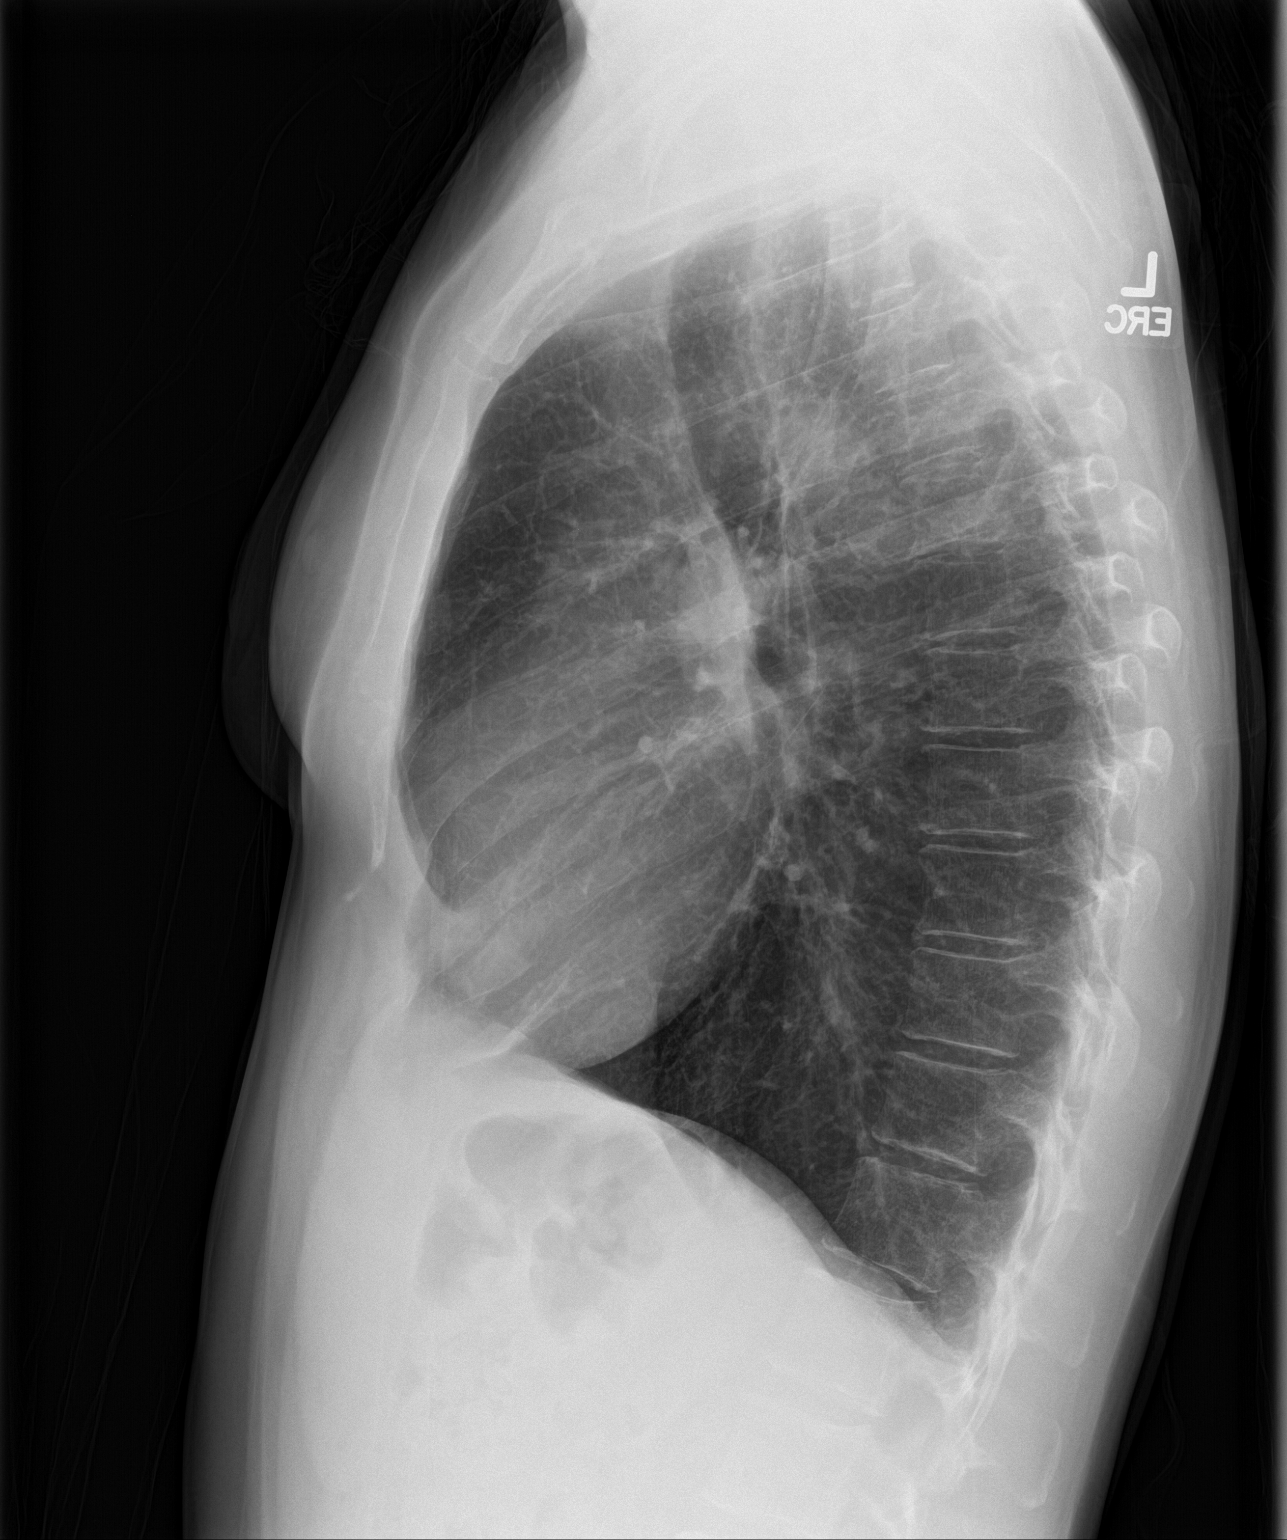

[2 of 2 positions shown; findings below may reference images not displayed]

FINDINGS: Cardiac silhouette normal in size, unchanged. Thoracic aorta mildly
tortuous and atherosclerotic. Prominent central pulmonary arteries.
Hyperinflation with emphysematous changes throughout both lungs.
Lungs clear. Bronchovascular markings normal. Pulmonary vascularity
normal. No visible pleural effusions. No pneumothorax. Remote healed
right 5th through eighth rib fractures.
IMPRESSION: COPD/emphysema.  No acute cardiopulmonary disease.

## 2017-08-30 ENCOUNTER — Emergency Department

## 2017-08-30 ENCOUNTER — Other Ambulatory Visit: Payer: Self-pay

## 2017-08-30 ENCOUNTER — Emergency Department
Admission: EM | Admit: 2017-08-30 | Discharge: 2017-08-30 | Disposition: A | Discharge status: Home / Self Care | Attending: Emergency Medicine | Admitting: Emergency Medicine

## 2017-08-30 DIAGNOSIS — F17210 Nicotine dependence, cigarettes, uncomplicated: Secondary | ICD-10-CM | POA: Diagnosis not present

## 2017-08-30 DIAGNOSIS — N939 Abnormal uterine and vaginal bleeding, unspecified: Secondary | ICD-10-CM | POA: Insufficient documentation

## 2017-08-30 LAB — URINALYSIS, COMPLETE (UACMP) WITH MICROSCOPIC
BILIRUBIN URINE: NEGATIVE
Bacteria, UA: NONE SEEN
GLUCOSE, UA: NEGATIVE mg/dL
Ketones, ur: NEGATIVE mg/dL
LEUKOCYTES UA: NEGATIVE
NITRITE: NEGATIVE
PH: 5 (ref 5.0–8.0)
Protein, ur: NEGATIVE mg/dL
Specific Gravity, Urine: 1.016 (ref 1.005–1.030)

## 2017-08-30 LAB — COMPREHENSIVE METABOLIC PANEL
ALBUMIN: 4.6 g/dL (ref 3.5–5.0)
ALT: 23 U/L (ref 0–44)
AST: 22 U/L (ref 15–41)
Alkaline Phosphatase: 99 U/L (ref 38–126)
Anion gap: 10 (ref 5–15)
BUN: 18 mg/dL (ref 6–20)
CO2: 22 mmol/L (ref 22–32)
Calcium: 9.5 mg/dL (ref 8.9–10.3)
Chloride: 107 mmol/L (ref 98–111)
Creatinine, Ser: 1.04 mg/dL — ABNORMAL HIGH (ref 0.44–1.00)
GFR calc Af Amer: >60 mL/min (ref >60)
GFR calc non Af Amer: >60 mL/min (ref >60)
GLUCOSE: 81 mg/dL (ref 70–99)
POTASSIUM: 4.4 mmol/L (ref 3.5–5.1)
SODIUM: 139 mmol/L (ref 135–145)
TOTAL PROTEIN: 8 g/dL (ref 6.5–8.1)
Total Bilirubin: 0.7 mg/dL (ref 0.3–1.2)

## 2017-08-30 LAB — CBC
HEMATOCRIT: 34.5 % — AB (ref 35.0–47.0)
HEMOGLOBIN: 11.3 g/dL — AB (ref 12.0–16.0)
MCH: 21.6 pg — ABNORMAL LOW (ref 26.0–34.0)
MCHC: 32.8 g/dL (ref 32.0–36.0)
MCV: 65.7 fL — ABNORMAL LOW (ref 80.0–100.0)
PLATELETS: 265 10*3/uL (ref 150–440)
RBC: 5.26 MIL/uL — ABNORMAL HIGH (ref 3.80–5.20)
RDW: 14.8 % — ABNORMAL HIGH (ref 11.5–14.5)
WBC: 10.2 10*3/uL (ref 3.6–11.0)

## 2017-08-30 LAB — PREGNANCY, URINE: Preg Test, Ur: NEGATIVE

## 2017-08-30 LAB — LIPASE, BLOOD: Lipase: 65 U/L — ABNORMAL HIGH (ref 11–51)

## 2017-08-30 MED ORDER — GADOBENATE DIMEGLUMINE 529 MG/ML IV SOLN
15.0000 mL | Freq: Once | INTRAVENOUS | Status: AC | PRN
  Administered 2017-08-30: 12 mL via INTRAVENOUS

## 2017-08-30 NOTE — ED Notes (Signed)
Per [place] staff they need a urine pregnancy prior to pt going to [place]. Informed us staff that pt had hysterectomy but per [place] staff they still need negative urine preg. Added on to urine already in lab. Lab called and made aware.

## 2017-08-30 NOTE — ED Notes (Signed)
Pt returned from MRI. ABCs intact. NAD

## 2017-08-30 NOTE — ED Provider Notes (Signed)
[facility] Emergency Department Provider Note   ____________________________________________   First MD Initiated Contact with Patient 08/30/17 [phone]     (approximate)  I have reviewed the triage vital signs and the nursing notes.   HISTORY  Chief Complaint Vaginal Bleeding   HPI Alejandra Henry is a 52 y.o. female patient had an episode of vaginal bleeding last week in which she passed a large clot.  She has not had any since.  She is also had some lower back pain over the previous week or so.  Her doctor had given her some hydrocodone.  She is here now for a visit with family.  Patient had history of cervical cancer some years ago and had had a hysterectomy.  Is not currently having any pain. Past Medical History:  Diagnosis Date  . AC (acromioclavicular) joint bone spurs   . Arthritis   . Cancer ([place])   . Cervical cancer ([place])   . Cervical cancer ([place])   . Kidney stones     There are no active problems to display for this patient.   Past Surgical History:  Procedure Laterality Date  . ABDOMINAL HYSTERECTOMY      Prior to Admission medications   Medication Sig Start Date End Date Taking? Authorizing Provider  HYDROcodone-Acetaminophen 7.5-300 MG TABS Take 1 tablet by mouth 2 (two) times daily. 08/10/17  Yes [provider]  albuterol (PROVENTIL HFA;VENTOLIN HFA) 108 (90 BASE) MCG/ACT inhaler Inhale 2 puffs into the lungs every 6 (six) hours as needed for wheezing or shortness of breath. Patient not taking: Reported on 08/30/2017 11/08/14   Sable Feil, PA-C  diclofenac (VOLTAREN) 75 MG EC tablet Take 1 tablet (75 mg total) by mouth 2 (two) times daily. Patient not taking: Reported on 08/30/2017 11/24/14   Beers, Pierce Crane, PA-C  HYDROcodone-acetaminophen (NORCO) 5-325 MG tablet Take 1-2 tablets by mouth every 4 (four) hours as needed for moderate pain. Patient not taking: Reported on 08/30/2017 11/24/14   Arlyss Repress, PA-C     Allergies Patient has no known allergies.  Family History  Problem Relation Age of Onset  . Cancer Mother     Social History Social History   Tobacco Use  . Smoking status: Current Every Day Smoker    Packs/day: 1.00  . Smokeless tobacco: Never Used  Substance Use Topics  . Alcohol use: No  . Drug use: No    Review of Systems  Constitutional: No fever/chills Eyes: No visual changes. ENT: No sore throat. Cardiovascular: Denies chest pain. Respiratory: Denies shortness of breath. Gastrointestinal: No abdominal pain.  No nausea, no vomiting.  No diarrhea.  No constipation. Genitourinary: Negative for dysuria. Musculoskeletal: Negative for back pain. Skin: Negative for rash. Neurological: Negative for headaches, focal weakness   ____________________________________________   PHYSICAL EXAM:  VITAL SIGNS: ED Triage Vitals  Enc Vitals Group     BP 08/30/17 0816 (!) 148/88     Pulse Rate 08/30/17 0816 86     Resp 08/30/17 0816 16     Temp 08/30/17 0816 97.8 F (36.6 C)     Temp Source 08/30/17 0816 Oral     SpO2 08/30/17 0816 100 %     Weight 08/30/17 0817 136 lb (61.7 kg)     Height 08/30/17 0817 5\' 1"  (1.549 m)     Head Circumference --      Peak Flow --      Pain Score 08/30/17 0817 0     Pain  Loc --      Pain Edu? --      Excl. in [place]? --     Constitutional: Alert and oriented. Well appearing and in no acute distress. Eyes: Conjunctivae are normal. PERRL. EOMI. Head: Atraumatic. Nose: No congestion/rhinnorhea. Mouth/Throat: Mucous membranes are moist.  Oropharynx neck: No stridor.}n-erythematous. Cardiovascular: Normal rate, regular rhythm. Grossly normal heart sounds.  Good peripheral circulation. Respiratory: Normal respiratory effort.  No retractions. Lungs CTAB. Gastrointestinal: Soft and nontender. No distention. No abdominal bruits. No CVA tenderness. Musculoskeletal: No lower extremity tenderness nor edema.  No joint effusions. Neurologic:   Normal speech and language. No gross focal neurologic deficits are appreciated. No gait instability. Skin:  Skin is warm, dry and intact. No rash noted. Psychiatric: Mood and affect are normal. Speech and behavior are normal. GYN: Normal perineum vagina somewhat atrophic with easily visible blood vessels no discharge no masses on exam ____________________________________________   LABS (all labs ordered are listed, but only abnormal results are displayed)  Labs Reviewed  LIPASE, BLOOD - Abnormal; Notable for the following components:      Result Value   Lipase 65 (*)    All other components within normal limits  COMPREHENSIVE METABOLIC PANEL - Abnormal; Notable for the following components:   Creatinine, Ser 1.04 (*)    All other components within normal limits  CBC - Abnormal; Notable for the following components:   RBC 5.26 (*)    Hemoglobin 11.3 (*)    HCT 34.5 (*)    MCV 65.7 (*)    MCH 21.6 (*)    RDW 14.8 (*)    All other components within normal limits  URINALYSIS, COMPLETE (UACMP) WITH MICROSCOPIC - Abnormal; Notable for the following components:   Color, Urine YELLOW (*)    APPearance CLEAR (*)    Hgb urine dipstick SMALL (*)    All other components within normal limits  PREGNANCY, URINE   ____________________________________________  EKG   ____________________________________________  RADIOLOGY  ED MD interpretation:   Official radiology report(s): US Pelvis Complete  Result Date: 08/30/2017 CLINICAL DATA:  52 year old with vaginal bleeding last week. Hysterectomy with bilateral salpingo oophorectomy in 2012 for cervical cancer. EXAM: TRANSABDOMINAL ULTRASOUND OF PELVIS TECHNIQUE: Transabdominal ultrasound examination of the pelvis was performed including evaluation of the uterus, ovaries, adnexal regions, and pelvic cul-de-sac. COMPARISON:  None. FINDINGS: Uterus Measurements: Surgically absent. Right ovary Measurements: Surgically absent.  No evidence of  adnexal mass. Left ovary Measurements: Surgically absent.  No evidence of adnexal mass. Other findings:  No abnormal free fluid. IMPRESSION: No pelvic abnormality identified post total hysterectomy. Correlation with gynecological examination recommended. If further imaging warranted, consider pelvic MRI. Electronically Signed   By: Richardean Sale M.D.   On: 08/30/2017 12:37    ____________________________________________   PROCEDURES  Procedure(s) performed:  Procedures  Critical Care performed:   ____________________________________________   INITIAL IMPRESSION / ASSESSMENT AND PLAN / ED COURSE       Clinical Course as of Aug 30 1517  Wed Aug 30, 2017  1145 Total Protein: 8.0 [PM]    Clinical Course User Index [PM] Nena Polio, MD     ____________________________________________   FINAL CLINICAL IMPRESSION(S) / ED DIAGNOSES  Final diagnoses:  Vaginal bleeding     ED Discharge Orders    None       Note:  This document was prepared using Dragon voice recognition software and may include unintentional dictation errors.    Nena Polio, MD 08/30/17 [phone]

## 2017-08-30 NOTE — ED Notes (Signed)
Pt to MRI. ABCs intact. NAD.

## 2017-08-30 NOTE — ED Triage Notes (Signed)
Pt is here visiting family, states last week she had vaginal bleeding and has been having lower to mid back pain for over a week. States she had cervical cancer and had a hysterectomy in 2012.

## 2017-08-30 NOTE — Discharge Instructions (Addendum)
The test that we did here are negative.  It may be that she had some bleeding from the thinning wall of the vagina.  I would follow-up with your OB/GYN doctor when you get home.

## 2017-08-30 NOTE — ED Notes (Signed)
Pt returned from [place]. ABCs intact. NAD.

## 2017-08-30 NOTE — ED Notes (Signed)
Patient reports lower back pain and cramping as well as generalized weakness and malaise for the last week. Reports intermittent vaginal spotting last week. Reports history of cervical cancer treated with chemo and radiation as well as a total hysterectomy in 2012.
